# Patient Record
Sex: Male | Born: 1995
Health system: Southern US, Community
[De-identification: ages and names within clinical notes are randomized; demographics above are authoritative.]

## PROBLEM LIST (undated history)

## (undated) DIAGNOSIS — K219 Gastro-esophageal reflux disease without esophagitis: Secondary | ICD-10-CM

## (undated) HISTORY — DX: Gastro-esophageal reflux disease without esophagitis: K21.9

## (undated) HISTORY — PX: HERNIA REPAIR: SHX51

---
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
## 2016-09-05 ENCOUNTER — Ambulatory Visit (HOSPITAL_COMMUNITY)
Admission: EM | Admit: 2016-09-05 | Discharge: 2016-09-05 | Disposition: A | Discharge status: Home / Self Care | Payer: Federal, State, Local not specified - PPO | Attending: Family Medicine | Admitting: Family Medicine

## 2016-09-05 ENCOUNTER — Encounter (HOSPITAL_COMMUNITY): Payer: Self-pay | Admitting: *Deleted

## 2016-09-05 DIAGNOSIS — S39011A Strain of muscle, fascia and tendon of abdomen, initial encounter: Secondary | ICD-10-CM

## 2016-09-05 NOTE — ED Provider Notes (Signed)
[facility]    CSN: [id number] Arrival date & time: 09/05/16  1425     History   Chief Complaint Chief Complaint  Patient presents with  . Abdominal Pain    HPI [name] is a 21 y.o. male.   The history is provided by the patient and a parent.  Abdominal Pain  Pain location:  Suprapubic Pain quality: dull   Pain severity:  Mild Onset quality:  Gradual Duration:  8 weeks Progression:  Worsening Chronicity:  New Context: previous surgery   Relieved by:  Acetaminophen Worsened by:  Nothing Associated symptoms: no anorexia, no belching, no chest pain and no fever     Past Medical History:  Diagnosis Date  . GERD (gastroesophageal reflux disease)     There are no active problems to display for this patient.   Past Surgical History:  Procedure Laterality Date  . HERNIA REPAIR         Home Medications    Prior to Admission medications   Medication Sig Start Date End Date Taking? Authorizing Provider  HYDROcodone-acetaminophen (NORCO) 5-325 MG per tablet Take 1 tablet by mouth every 8 (eight) hours as needed for pain. 04/02/13   Collene Gobble, MD  ibuprofen (ADVIL,MOTRIN) 200 MG tablet Take 200 mg by mouth every 6 (six) hours as needed for pain.    Historical Provider, MD    Family History Family History  Problem Relation Age of Onset  . Hypertension Mother   . Arthritis Mother     Social History Social History  Substance Use Topics  . Smoking status: Never Smoker  . Smokeless tobacco: Not on file  . Alcohol use No     Allergies   Patient has no known allergies.   Review of Systems Review of Systems  Constitutional: Negative.  Negative for fever.  Cardiovascular: Negative.  Negative for chest pain.  Gastrointestinal: Positive for abdominal pain. Negative for anorexia.  Genitourinary: Negative.   Musculoskeletal: Negative.   All other systems reviewed and are negative.    Physical Exam Triage Vital Signs ED Triage  Vitals  Enc Vitals Group     BP 09/05/16 1503 112/70     Pulse Rate 09/05/16 1503 78     Resp 09/05/16 1503 18     Temp 09/05/16 1503 98.6 F (37 C)     Temp Source 09/05/16 1503 Oral     SpO2 09/05/16 1503 100 %     Weight --      Height --      Head Circumference --      Peak Flow --      Pain Score 09/05/16 1505 5     Pain Loc --      Pain Edu? --      Excl. in GC? --    No data found.   Updated Vital Signs BP 112/70 (BP Location: Right Arm)   Pulse 78   Temp 98.6 F (37 C) (Oral)   Resp 18   SpO2 100%   Visual Acuity Right Eye Distance:   Left Eye Distance:   Bilateral Distance:    Right Eye Near:   Left Eye Near:    Bilateral Near:     Physical Exam  Constitutional: He appears well-developed and well-nourished.  Pulmonary/Chest: Effort normal and breath sounds normal.  Abdominal: Soft. Bowel sounds are normal. He exhibits no distension and no mass. There is no tenderness. There is no rebound and no guarding. No hernia.  Musculoskeletal: Normal range of motion.  Neurological: He is alert.  Skin: Skin is warm.  Nursing note and vitals reviewed.    UC Treatments / Results  Labs (all labs ordered are listed, but only abnormal results are displayed) Labs Reviewed - No data to display  EKG  EKG Interpretation None       Radiology No results found.  Procedures Procedures (including critical care time)  Medications Ordered in UC Medications - No data to display   Initial Impression / Assessment and Plan / UC Course  I have reviewed the triage vital signs and the nursing notes.  Pertinent labs & imaging results that were available during my care of the patient were reviewed by me and considered in my medical decision making (see chart for details).       Final Clinical Impressions(s) / UC Diagnoses   Final diagnoses:  None    New Prescriptions New Prescriptions   No medications on file     Linna HoffJames D Lisandro Meggett, MD 09/05/16 1529

## 2016-09-05 NOTE — ED Triage Notes (Signed)
Pt    Reports   Pain     Lower   abd    And      With  Pain    r     Side     -     Pt  Reports  That       He   Was       At   Work  Today   Lifting         And  He  Felt       Some    Pressure        Pain  Was   Worse   Earlier       He  Has  A  History      Of     Hernia     Surgery     In  Past    r  Side   About  8  Years  Ago

## 2016-09-07 DIAGNOSIS — J111 Influenza due to unidentified influenza virus with other respiratory manifestations: Secondary | ICD-10-CM | POA: Diagnosis not present

## 2016-11-30 DIAGNOSIS — K080 Exfoliation of teeth due to systemic causes: Secondary | ICD-10-CM | POA: Diagnosis not present

## 2017-03-29 DIAGNOSIS — K080 Exfoliation of teeth due to systemic causes: Secondary | ICD-10-CM | POA: Diagnosis not present

## 2017-06-21 DIAGNOSIS — K080 Exfoliation of teeth due to systemic causes: Secondary | ICD-10-CM | POA: Diagnosis not present

## 2017-08-05 DIAGNOSIS — K080 Exfoliation of teeth due to systemic causes: Secondary | ICD-10-CM | POA: Diagnosis not present

## 2017-09-04 DIAGNOSIS — S93492A Sprain of other ligament of left ankle, initial encounter: Secondary | ICD-10-CM | POA: Diagnosis not present

## 2017-10-08 DIAGNOSIS — H66003 Acute suppurative otitis media without spontaneous rupture of ear drum, bilateral: Secondary | ICD-10-CM | POA: Diagnosis not present

## 2017-10-08 DIAGNOSIS — H10013 Acute follicular conjunctivitis, bilateral: Secondary | ICD-10-CM | POA: Diagnosis not present

## 2018-06-14 ENCOUNTER — Ambulatory Visit: Payer: Federal, State, Local not specified - PPO | Admitting: Adult Health

## 2018-06-23 ENCOUNTER — Ambulatory Visit (INDEPENDENT_AMBULATORY_CARE_PROVIDER_SITE_OTHER): Payer: Federal, State, Local not specified - PPO

## 2018-06-23 ENCOUNTER — Encounter: Payer: Self-pay | Admitting: Family Medicine

## 2018-06-23 ENCOUNTER — Other Ambulatory Visit: Payer: Self-pay

## 2018-06-23 ENCOUNTER — Ambulatory Visit (INDEPENDENT_AMBULATORY_CARE_PROVIDER_SITE_OTHER): Payer: Federal, State, Local not specified - PPO | Admitting: Family Medicine

## 2018-06-23 VITALS — BP 125/73 | HR 78 | Temp 98.4°F | Resp 14 | Ht 72.0 in | Wt 138.6 lb

## 2018-06-23 DIAGNOSIS — J029 Acute pharyngitis, unspecified: Secondary | ICD-10-CM | POA: Diagnosis not present

## 2018-06-23 DIAGNOSIS — R509 Fever, unspecified: Secondary | ICD-10-CM | POA: Diagnosis not present

## 2018-06-23 DIAGNOSIS — R079 Chest pain, unspecified: Secondary | ICD-10-CM | POA: Diagnosis not present

## 2018-06-23 DIAGNOSIS — R05 Cough: Secondary | ICD-10-CM | POA: Diagnosis not present

## 2018-06-23 DIAGNOSIS — R059 Cough, unspecified: Secondary | ICD-10-CM

## 2018-06-23 DIAGNOSIS — J680 Bronchitis and pneumonitis due to chemicals, gases, fumes and vapors: Secondary | ICD-10-CM

## 2018-06-23 DIAGNOSIS — R0781 Pleurodynia: Secondary | ICD-10-CM | POA: Diagnosis not present

## 2018-06-23 DIAGNOSIS — J689 Unspecified respiratory condition due to chemicals, gases, fumes and vapors: Secondary | ICD-10-CM

## 2018-06-23 DIAGNOSIS — R0989 Other specified symptoms and signs involving the circulatory and respiratory systems: Secondary | ICD-10-CM

## 2018-06-23 LAB — POCT CBC
Granulocyte percent: 73.2 %G (ref 37–80)
HCT, POC: 42.3 % — AB (ref 29–41)
Hemoglobin: 14.5 g/dL — AB (ref 9.5–13.5)
LYMPH, POC: 2.2 (ref 0.6–3.4)
MCH: 32.8 pg — AB (ref 27–31.2)
MCHC: 34.4 g/dL (ref 31.8–35.4)
MCV: 95.2 fL (ref 76–111)
MID (CBC): 0.8 (ref 0–0.9)
MPV: 8.2 fL (ref 0–99.8)
POC GRANULOCYTE: 8.1 — AB (ref 2–6.9)
POC LYMPH PERCENT: 19.9 %L (ref 10–50)
POC MID %: 6.9 % (ref 0–12)
Platelet Count, POC: 220 10*3/uL (ref 142–424)
RBC: 4.44 M/uL — AB (ref 4.69–6.13)
RDW, POC: 12.5 %
WBC: 11 10*3/uL — AB (ref 4.6–10.2)

## 2018-06-23 LAB — POC INFLUENZA A&B (BINAX/QUICKVUE)
INFLUENZA B, POC: NEGATIVE
Influenza A, POC: NEGATIVE

## 2018-06-23 LAB — POCT SEDIMENTATION RATE: POCT SED RATE: 5 mm/hr (ref 0–22)

## 2018-06-23 MED ORDER — IPRATROPIUM BROMIDE 0.02 % IN SOLN
0.5000 mg | Freq: Once | RESPIRATORY_TRACT | Status: AC
  Administered 2018-06-23: 0.5 mg via RESPIRATORY_TRACT

## 2018-06-23 MED ORDER — PREDNISONE 20 MG PO TABS: ORAL_TABLET | ORAL | 0 refills | Status: DC

## 2018-06-23 MED ORDER — ALBUTEROL SULFATE HFA 108 (90 BASE) MCG/ACT IN AERS: 2.0000 | INHALATION_SPRAY | RESPIRATORY_TRACT | 0 refills | Status: DC | PRN

## 2018-06-23 MED ORDER — ALBUTEROL SULFATE (2.5 MG/3ML) 0.083% IN NEBU
2.5000 mg | INHALATION_SOLUTION | Freq: Once | RESPIRATORY_TRACT | Status: AC
  Administered 2018-06-23: 2.5 mg via RESPIRATORY_TRACT

## 2018-06-23 MED ORDER — AMOXICILLIN-POT CLAVULANATE 875-125 MG PO TABS: 1.0000 | ORAL_TABLET | Freq: Two times a day (BID) | ORAL | 0 refills | Status: DC

## 2018-06-23 NOTE — Progress Notes (Signed)
Subjective:    Patient: Calvin GirtDaniel D Foley  DOB: [birth year]; 22 y.o.   MRN: [id number]  Chief Complaint  Patient presents with  . New Patient (Initial Visit)    exposed to chemical burning plastic, now having problems with chest congestion, bleeding nose, stuffy nose, chest tightness feels like a loss of chest compasity.  Marland Kitchen. PHQ9    Score total 17    HPI Works in [employer] job and on Tuesday -2d ago - they burned Government social research officerplastics and he was noticing extra fatigue and more out of it on Tuesday and Wed and then woke up today and feeling really sick, achy, myalgias, so weak he could only get out bed - sneezed with blood clots then right nare started bleeding - eventually tried to go back into work.   Doesn't have a PCP.     Medical History Past Medical History:  Diagnosis Date  . GERD (gastroesophageal reflux disease)    Past Surgical History:  Procedure Laterality Date  . HERNIA REPAIR     No current outpatient medications on file prior to visit.   No current facility-administered medications on file prior to visit.    No Known Allergies Family History  Problem Relation Age of Onset  . Hypertension Mother   . Arthritis Mother    Social History   Socioeconomic History  . Marital status: Single    Spouse name: Not on file  . Number of children: Not on file  . Years of education: Not on file  . Highest education level: Not on file  Occupational History  . Not on file  Social Needs  . Financial resource strain: Not on file  . Food insecurity:    Worry: Not on file    Inability: Not on file  . Transportation needs:    Medical: Not on file    Non-medical: Not on file  Tobacco Use  . Smoking status: Current Every Day Smoker    Types: Cigarettes  . Smokeless tobacco: Current User  . Tobacco comment: only on occ  Substance and Sexual Activity  . Alcohol use: Yes  . Drug use: No  . Sexual activity: Yes  Lifestyle  . Physical activity:    Days per week: Not on file   Minutes per session: Not on file  . Stress: Not on file  Relationships  . Social connections:    Talks on phone: Not on file    Gets together: Not on file    Attends religious service: Not on file    Active member of club or organization: Not on file    Attends meetings of clubs or organizations: Not on file    Relationship status: Not on file  Other Topics Concern  . Not on file  Social History Narrative  . Not on file   [facility] 06/23/2018  Decreased Interest 1  Down, Depressed, Hopeless 2  PHQ - 2 Score 3  Altered sleeping 3  Tired, decreased energy 3  Change in appetite 1  Feeling bad or failure about yourself  1  Trouble concentrating 3  Moving slowly or fidgety/restless 3  Suicidal thoughts 0  PHQ-9 Score 17  Difficult doing work/chores Somewhat difficult    ROS As noted in HPI  Objective:  BP 125/73 (BP Location: Right Arm, Patient Position: Sitting, Cuff Size: Normal)   Pulse 78   Temp 98.4 F (36.9 C) (Oral)   Resp 14   Ht 6' (1.829 m)   Wt 138  lb 9.6 oz (62.9 kg)   SpO2 97%   BMI 18.80 kg/m  Physical Exam Constitutional:      General: He is not in acute distress.    Appearance: He is well-developed. He is not diaphoretic.  HENT:     Head: Normocephalic and atraumatic.  Eyes:     General: No scleral icterus.    Conjunctiva/sclera: Conjunctivae normal.     Pupils: Pupils are equal, round, and reactive to light.  Neck:     Musculoskeletal: Normal range of motion and neck supple.     Thyroid: No thyromegaly.  Cardiovascular:     Rate and Rhythm: Normal rate and regular rhythm.     Heart sounds: Normal heart sounds.  Pulmonary:     Effort: Pulmonary effort is normal. No respiratory distress.     Breath sounds: Normal breath sounds.  Lymphadenopathy:     Cervical: No cervical adenopathy.  Skin:    General: Skin is warm and dry.  Neurological:     Mental Status: He is alert and oriented to person, place, and time.  Psychiatric:         Behavior: Behavior normal.     POC TESTING Office Visit on 06/23/2018  Component Date Value Ref Range Status  . WBC 06/23/2018 11.0* 4.6 - 10.2 K/uL Final  . Lymph, poc 06/23/2018 2.2  0.6 - 3.4 Final  . POC LYMPH PERCENT 06/23/2018 19.9  10 - 50 %L Final  . MID (cbc) 06/23/2018 0.8  0 - 0.9 Final  . POC MID % 06/23/2018 6.9  0 - 12 %M Final  . POC Granulocyte 06/23/2018 8.1* 2 - 6.9 Final  . Granulocyte percent 06/23/2018 73.2  37 - 80 %G Final  . RBC 06/23/2018 4.44* 4.69 - 6.13 M/uL Final  . Hemoglobin 06/23/2018 14.5* 9.5 - 13.5 g/dL Final  . HCT, POC 16/04/9603 42.3* 29 - 41 % Final  . MCV 06/23/2018 95.2  76 - 111 fL Final  . MCH, POC 06/23/2018 32.8* 27 - 31.2 pg Final  . MCHC 06/23/2018 34.4  31.8 - 35.4 g/dL Final  . RDW, POC 54/03/8118 12.5  % Final  . Platelet Count, POC 06/23/2018 220  142 - 424 K/uL Final  . MPV 06/23/2018 8.2  0 - 99.8 fL Final  . Influenza A, POC 06/23/2018 Negative  Negative Final  . Influenza B, POC 06/23/2018 Negative  Negative Final     Dg Chest 2 View  Result Date: 06/23/2018 CLINICAL DATA:  pleuritic chest pain and cough with acute illness x 2d since significant exposure to breathing in plastic EXAM: CHEST - 2 VIEW COMPARISON:  01/07/2012 FINDINGS: The heart size and mediastinal contours are within normal limits. Both lungs are clear. The visualized skeletal structures are unremarkable. IMPRESSION: No active cardiopulmonary disease. Electronically Signed   By: Norva Pavlov M.D.   On: 06/23/2018 15:06   Peak flow 350 Post-duo neb peak flow 475. Predicted 608  Assessment & Plan:   1. Cough   2. Pleurodynia   3. Fever, unspecified   4. Sore throat   5. Inhalation injury due to chemical (HCC)   6. Abnormally low peak expiratory flow rate   7. Acute chemical pneumonitis [facility])     Patient will continue on current chronic medications other than changes noted above, so ok to refill when needed.   See after visit summary for  patient specific instructions.  Orders Placed This Encounter  Procedures  . DG Chest 2 View  Standing Status:   Future    Number of Occurrences:   1    Standing Expiration Date:   06/23/2019    Order Specific Question:   Reason for Exam (SYMPTOM  OR DIAGNOSIS REQUIRED)    Answer:   pleuritic chest pain and cough wiht acute illness x 2d since significant exposure to breathing in plastic    Order Specific Question:   Preferred imaging location?    Answer:   External  . Ambulatory referral to Pulmonology    Referral Priority:   Routine    Referral Type:   Consultation    Referral Reason:   Specialty Services Required    Requested Specialty:   Pulmonary Disease    Number of Visits Requested:   1  . Care order/instruction:    Scheduling Instructions:     Peak Flow (IF NEB IS ORDERED PLEASE DO BEFORE AND AFTER NEB)  . POCT CBC  . POCT SEDIMENTATION RATE  . POC Influenza A&B(BINAX/QUICKVUE)    Meds ordered this encounter  Medications  . albuterol (PROVENTIL) (2.5 MG/3ML) 0.083% nebulizer solution 2.5 mg  . ipratropium (ATROVENT) nebulizer solution 0.5 mg  . amoxicillin-clavulanate (AUGMENTIN) 875-125 MG tablet    Sig: Take 1 tablet by mouth 2 (two) times daily.    Dispense:  14 tablet    Refill:  0  . predniSONE (DELTASONE) 20 MG tablet    Sig: Take 1 tab now, the 3 tabs po qam x 3d starting 12/6 am, then 2 tabs po qam x 3d, then 1 tab po qam x 3d    Dispense:  19 tablet    Refill:  0  . albuterol (PROVENTIL HFA;VENTOLIN HFA) 108 (90 Base) MCG/ACT inhaler    Sig: Inhale 2 puffs into the lungs every 4 (four) hours as needed for wheezing or shortness of breath.    Dispense:  1 Inhaler    Refill:  0    Patient verbalized to me that they understand the following: diagnosis, what is being done for them, what to expect and what should be done at home.  Their questions have been answered. They understand that I am unable to predict every possible medication interaction or adverse  outcome and that if any unexpected symptoms arise, they should contact us and their pharmacist, as well as never hesitate to seek urgent/emergent care at [facility] or ER if they think it might be warranted.    Norberto Sorenson, MD, MPH Primary Care at [facility] [address] [place], [place]  [address] [phone] Office phone  [phone] Office fax  06/23/18 2:38 PM

## 2018-06-23 NOTE — Patient Instructions (Addendum)
[facility] [address], [address], [place], [place] [address] Phone: [phone]  [facility] [address] [place], [place] [address] [phone]  [facility] [address] [place], [place] [address] [phone]  [facility] [address] [place], [place] [address] [phone]   [facility] [address], [place], [place] [address]  [phone]     If you have lab work done today you will be contacted with your lab results within the next 2 weeks.  If you have not heard from us then please contact us. The fastest way to get your results is to register for My Chart.   IF you received an x-ray today, you will receive an invoice from [facility]. Please contact [facility] at [phone] with questions or concerns regarding your invoice.   IF you received labwork today, you will receive an invoice from [facility]. Please contact [facility] at [phone] with questions or concerns regarding your invoice.   Our billing staff will not be able to assist you with questions regarding bills from these companies.  You will be contacted with the lab results as soon as they are available. The fastest way to get your results is to activate your My Chart account. Instructions are located on the last page of this paperwork. If you have not heard from us regarding the results in 2 weeks, please contact this office.     Pneumonitis Pneumonitis is inflammation of the lungs. Infection or exposure to certain substances or allergens can cause this condition. Allergens are substances that you are allergic to. What are the causes? This condition may be caused by:  An infection from bacteria or a virus (pneumonia).  Exposure to certain substances in the workplace. This includes working on farms and in certain industries. Some  substances that can cause this condition include asbestos, silica, inhaled acids, or inhaled chlorine gas.  Repeated exposure to bird feathers, bird feces, or other allergens.  Medicines such as chemotherapy drugs, certain antibiotic medicines, and some heart medicines.  Radiation therapy.  Exposure to mold. A hot tub, sauna, or home humidifier can have mold growing in it, even if it looks clean. You can breathe in the mold through water vapor.  Breathing in (aspirating) stomach contents, food, or liquids into the lungs.  What are the signs or symptoms? Symptoms of this condition include:  Shortness of breath or trouble breathing. This is the most common symptom.  Cough.  Fever.  Decreased energy.  Decreased appetite.  How is this diagnosed? This condition may be diagnosed based on:  Your medical history.  Physical exam.  Blood tests.  Other tests, including: ? Pulmonary function test (PFT). This measures how well your lungs work. ? Chest X-ray. ? CT scan of the lungs. ? Bronchoscopy. In this procedure, your health care provider looks at your airways through an instrument called a bronchoscope. ? Lung biopsy. In this procedure, your health care provider takes a small piece of tissue from your lungs to examine it.  How is this treated? Treatment depends on the cause of the condition. If the cause is exposure to a substance, avoiding further exposure to that substance will help reduce your symptoms. Possible medical treatments for pneumonitis include:  Corticosteroid medicine to help decrease inflammation.  Antibiotic medicine to help fight an infection caused by bacteria.  Bronchodilators or inhalers to help relax the muscles and make breathing  easier.  Oxygen therapy, if you are having trouble breathing.  Follow these instructions at home:  Take or use over-the-counter and prescription medicines only as told by your health care provider. This includes any inhaler  use.  Avoid exposure to any substance that caused your pneumonitis. If you need to work with substances that can cause pneumonitis, wear a mask to protect your lungs.  If you were prescribed an antibiotic, take it as told by your health care provider. Do not stop taking the antibiotic even if you start to feel better.  If you were prescribed an inhaler, keep it with you at all times.  Do not use any products that contain nicotine or tobacco, such as cigarettes and e-cigarettes. If you need help quitting, ask your health care provider.  Keep all follow-up visits as told by your health care provider. This is important. Contact a health care provider if:  You have a fever.  Your symptoms get worse. Get help right away if:  You have new or worse shortness of breath.  You develop a blue color (cyanosis) under your fingernails. Summary  Pneumonitis is inflammation of the lungs. This condition can be caused by infection or exposure to certain substances or allergens.  The most common symptom of this condition is shortness of breath or trouble breathing.  Treatment depends on the cause of your condition. This information is not intended to replace advice given to you by your health care provider. Make sure you discuss any questions you have with your health care provider. Document Released: 12/24/2009 Document Revised: 05/28/2016 Document Reviewed: 05/28/2016 Elsevier Interactive Patient Education  2017 [employer].

## 2018-07-17 ENCOUNTER — Other Ambulatory Visit: Payer: Self-pay

## 2018-07-17 ENCOUNTER — Encounter (HOSPITAL_COMMUNITY): Payer: Self-pay | Admitting: Emergency Medicine

## 2018-07-17 ENCOUNTER — Ambulatory Visit (HOSPITAL_COMMUNITY)
Admission: EM | Admit: 2018-07-17 | Discharge: 2018-07-17 | Disposition: A | Discharge status: Home / Self Care | Payer: Federal, State, Local not specified - PPO | Attending: Family Medicine | Admitting: Family Medicine

## 2018-07-17 DIAGNOSIS — R112 Nausea with vomiting, unspecified: Secondary | ICD-10-CM

## 2018-07-17 DIAGNOSIS — R197 Diarrhea, unspecified: Secondary | ICD-10-CM | POA: Diagnosis not present

## 2018-07-17 MED ORDER — ONDANSETRON 4 MG PO TBDP: 4.0000 mg | ORAL_TABLET | Freq: Three times a day (TID) | ORAL | 0 refills | Status: DC | PRN

## 2018-07-17 NOTE — ED Triage Notes (Signed)
[facility] a [facility].

## 2018-07-17 NOTE — ED Provider Notes (Signed)
[facility]    CSN: [id number] Arrival date & time: 07/17/18  1450     History   Chief Complaint Chief Complaint  Patient presents with  . appointment 3pm  . Nausea    HPI [name] is a 22 y.o. male history of GERD presenting today for evaluation of nausea vomiting and diarrhea.  Patient states for the past 4 to 5 days he is not felt well.  He has had dizziness and nausea that began on Tuesday.  She had some neck pain.  Noted hot and cold chills and believes he had fevers the first few days.  He has had a few episodes of vomiting.  Last episode was yesterday.  He is also had looser bowel movements.  Bowel movements ranging from loose to watery.  Mild congestion, mild cough, otherwise minimal URI symptoms.  States that diarrhea is slightly improving.  He is tolerated oral intake today.  He is taking over-the-counter stomach relief and antidiarrheals. HPI  Past Medical History:  Diagnosis Date  . GERD (gastroesophageal reflux disease)     There are no active problems to display for this patient.   Past Surgical History:  Procedure Laterality Date  . HERNIA REPAIR         Home Medications    Prior to Admission medications   Medication Sig Start Date End Date Taking? Authorizing Provider  ondansetron (ZOFRAN ODT) 4 MG disintegrating tablet Take 1 tablet (4 mg total) by mouth every 8 (eight) hours as needed for nausea or vomiting. 07/17/18   , Junius Creamer, PA-C    Family History Family History  Problem Relation Age of Onset  . Hypertension Mother   . Arthritis Mother     Social History Social History   Tobacco Use  . Smoking status: Current Every Day Smoker    Types: Cigarettes  . Smokeless tobacco: Current User  . Tobacco comment: only on occ  Substance Use Topics  . Alcohol use: Yes  . Drug use: No     Allergies   Patient has no known allergies.   Review of Systems Review of Systems  Constitutional: Positive for appetite  change and chills. Negative for activity change, fatigue and fever.  HENT: Positive for rhinorrhea. Negative for congestion, ear pain, sinus pressure, sore throat and trouble swallowing.   Eyes: Negative for discharge and redness.  Respiratory: Positive for cough. Negative for chest tightness and shortness of breath.   Cardiovascular: Negative for chest pain.  Gastrointestinal: Positive for abdominal pain, diarrhea and vomiting. Negative for blood in stool and nausea.  Musculoskeletal: Negative for myalgias.  Skin: Negative for rash.  Neurological: Positive for dizziness. Negative for light-headedness and headaches.     Physical Exam Triage Vital Signs ED Triage Vitals  Enc Vitals Group     BP 07/17/18 1542 111/71     Pulse Rate 07/17/18 1542 65     Resp 07/17/18 1542 16     Temp 07/17/18 1542 98.2 F (36.8 C)     Temp Source 07/17/18 1542 Oral     SpO2 07/17/18 1542 96 %     Weight --      Height --      Head Circumference --      Peak Flow --      Pain Score 07/17/18 1541 2     Pain Loc --      Pain Edu? --      Excl. in GC? --    No  data found.  Updated Vital Signs BP 111/71 (BP Location: Left Arm)   Pulse 65   Temp 98.2 F (36.8 C) (Oral)   Resp 16   SpO2 96%   Visual Acuity Right Eye Distance:   Left Eye Distance:   Bilateral Distance:    Right Eye Near:   Left Eye Near:    Bilateral Near:     Physical Exam Vitals signs and nursing note reviewed.  Constitutional:      Appearance: He is well-developed.     Comments: Nontoxic appearing, sitting comfortably on exam table  HENT:     Head: Normocephalic and atraumatic.     Ears:     Comments: Bilateral ears without tenderness to palpation of external auricle, tragus and mastoid, EAC's without erythema or swelling, TM's with good bony landmarks and cone of light. Non erythematous.    Mouth/Throat:     Comments: Oral mucosa pink and moist, no tonsillar enlargement or exudate. Posterior pharynx patent and  nonerythematous, no uvula deviation or swelling. Normal phonation. Eyes:     Conjunctiva/sclera: Conjunctivae normal.  Neck:     Musculoskeletal: Neck supple.  Cardiovascular:     Rate and Rhythm: Normal rate and regular rhythm.     Heart sounds: No murmur.  Pulmonary:     Effort: Pulmonary effort is normal. No respiratory distress.     Breath sounds: Normal breath sounds.     Comments: Breathing comfortably at rest, CTABL, no wheezing, rales or other adventitious sounds auscultated Abdominal:     Palpations: Abdomen is soft.     Tenderness: There is abdominal tenderness.     Comments: Soft, nondistended, mild tenderness to palpation throughout entire abdomen, increased on epigastrium and left upper lower quadrant, no focal tenderness, negative rebound, negative Rovsing, negative McBurney's.  Skin:    General: Skin is warm and dry.  Neurological:     Mental Status: He is alert.      UC Treatments / Results  Labs (all labs ordered are listed, but only abnormal results are displayed) Labs Reviewed - No data to display  EKG None  Radiology No results found.  Procedures Procedures (including critical care time)  Medications Ordered in UC Medications - No data to display  Initial Impression / Assessment and Plan / UC Course  I have reviewed the triage vital signs and the nursing notes.  Pertinent labs & imaging results that were available during my care of the patient were reviewed by me and considered in my medical decision making (see chart for details).    Vital signs stable, acute onset of nausea vomiting and diarrhea, most likely viral etiology.  Abdominal exam negative for peritoneal signs.  Do not suspect acute abdomen at this time.  Will recommend symptomatic and supportive care.  Zofran as needed for nausea and vomiting.  Continue to monitor for resolution of diarrhea.  Push fluids, oral rehydration.Discussed strict return precautions. Patient verbalized  understanding and is agreeable with plan.   Final Clinical Impressions(s) / UC Diagnoses   Final diagnoses:  Nausea vomiting and diarrhea     Discharge Instructions     Your nausea, vomiting, and diarrhea appear to have a viral cause. Your symptoms should improve over the next week as your body continues to rid the infectious cause.  For nausea: Zofran prescribed. Begin with every 6 hours, than as you are able to hold food down, take it as needed. Start with clear liquids, then move to plain foods like bananas, rice,  applesauce, toast, broth, grits, oatmeal. As those food settle okay you may transition to your normal foods. Avoid spicy and greasy foods as much as possible.  For Diarrhea: This is your body's natural way of getting rid of a virus. You may try taking 1 imodium to decrease amount of stools a day, but we do not want you to stop your diarrhea.   Preventing dehydration is key! You need to replace the fluid your body is expelling. Drink plenty of fluids, may use Pedialyte or sports drinks.   Please return if you are experiencing blood in your vomit or stool or experiencing dizziness, lightheadedness, extreme fatigue, increased abdominal pain.    ED Prescriptions    Medication Sig Dispense Auth. Provider   ondansetron (ZOFRAN ODT) 4 MG disintegrating tablet Take 1 tablet (4 mg total) by mouth every 8 (eight) hours as needed for nausea or vomiting. 20 tablet , Enc[place] C, PA-C     Controlled Substance Prescriptions [place] Controlled Substance Registry consulted? Not Applicable   Lew Dawes,  C, [place]PA-C 07/17/18 1610

## 2018-07-17 NOTE — Discharge Instructions (Signed)

## 2018-07-20 ENCOUNTER — Other Ambulatory Visit: Payer: Self-pay | Admitting: Family Medicine

## 2018-07-21 NOTE — Telephone Encounter (Signed)
Requested medication (s) are due for refill today: No - early  Requested medication (s) are on the active medication list: No  Last refill:  06/26/18  Future visit scheduled: No  Notes to clinic:  Medication not on list    Requested Prescriptions  Pending Prescriptions Disp Refills   albuterol (PROVENTIL HFA;VENTOLIN HFA) 108 (90 Base) MCG/ACT inhaler [Pharmacy Med Name: ALBUTEROL HFA (PROVENTIL) INH] 6.7 Inhaler 0    Sig: Inhale 2 puffs into the lungs every 4 (four) hours as needed for wheezing or shortness of breath.     Pulmonology:  Beta Agonists Failed - 07/20/2018  1:01 AM      Failed - One inhaler should last at least one month. If the patient is requesting refills earlier, contact the patient to check for uncontrolled symptoms.      Failed - Valid encounter within last 12 months    Recent Outpatient Visits          4 weeks ago Cough   [facility], Levell July, MD   5 years ago Pain in joint, ankle and foot, left   [facility], [facility]   5 years ago Pain in joint, ankle and foot, left   [facility]   5 years ago Right ankle pain   [facility], Rachelle Hora, DO      Future Appointments            In 6 days Philip Aspen, Limmie Patricia, MD [facility] [place], [facility]

## 2018-07-27 ENCOUNTER — Ambulatory Visit: Payer: Federal, State, Local not specified - PPO | Admitting: Internal Medicine

## 2018-07-27 DIAGNOSIS — Z0289 Encounter for other administrative examinations: Secondary | ICD-10-CM

## 2018-08-05 ENCOUNTER — Institutional Professional Consult (permissible substitution): Payer: Federal, State, Local not specified - PPO | Admitting: Pulmonary Disease

## 2018-08-19 ENCOUNTER — Institutional Professional Consult (permissible substitution): Payer: Federal, State, Local not specified - PPO | Admitting: Pulmonary Disease

## 2019-05-24 DIAGNOSIS — R05 Cough: Secondary | ICD-10-CM | POA: Diagnosis not present

## 2019-05-24 DIAGNOSIS — Z20828 Contact with and (suspected) exposure to other viral communicable diseases: Secondary | ICD-10-CM | POA: Diagnosis not present

## 2019-05-24 DIAGNOSIS — M791 Myalgia, unspecified site: Secondary | ICD-10-CM | POA: Diagnosis not present

## 2019-05-24 DIAGNOSIS — R5381 Other malaise: Secondary | ICD-10-CM | POA: Diagnosis not present

## 2019-06-20 DIAGNOSIS — Z20828 Contact with and (suspected) exposure to other viral communicable diseases: Secondary | ICD-10-CM | POA: Diagnosis not present

## 2019-08-26 DIAGNOSIS — Z20822 Contact with and (suspected) exposure to covid-19: Secondary | ICD-10-CM | POA: Diagnosis not present

## 2019-08-26 DIAGNOSIS — J029 Acute pharyngitis, unspecified: Secondary | ICD-10-CM | POA: Diagnosis not present

## 2019-11-21 DIAGNOSIS — Z20822 Contact with and (suspected) exposure to covid-19: Secondary | ICD-10-CM | POA: Diagnosis not present

## 2019-11-24 ENCOUNTER — Ambulatory Visit
Admission: EM | Admit: 2019-11-24 | Discharge: 2019-11-24 | Disposition: A | Discharge status: Home / Self Care | Payer: Federal, State, Local not specified - PPO | Attending: Emergency Medicine | Admitting: Emergency Medicine

## 2019-11-24 ENCOUNTER — Other Ambulatory Visit: Payer: Self-pay

## 2019-11-24 ENCOUNTER — Encounter: Payer: Self-pay | Admitting: Emergency Medicine

## 2019-11-24 DIAGNOSIS — J209 Acute bronchitis, unspecified: Secondary | ICD-10-CM | POA: Diagnosis not present

## 2019-11-24 DIAGNOSIS — K529 Noninfective gastroenteritis and colitis, unspecified: Secondary | ICD-10-CM

## 2019-11-24 MED ORDER — FLUTICASONE PROPIONATE 50 MCG/ACT NA SUSP
1.0000 | Freq: Every day | NASAL | 0 refills | Status: DC
Start: 2019-11-24 — End: 2020-06-06

## 2019-11-24 MED ORDER — ALBUTEROL SULFATE HFA 108 (90 BASE) MCG/ACT IN AERS: 2.0000 | INHALATION_SPRAY | RESPIRATORY_TRACT | 0 refills | Status: DC | PRN

## 2019-11-24 MED ORDER — AEROCHAMBER PLUS FLO-VU MEDIUM MISC
1.0000 | Freq: Once | 0 refills | Status: AC
Start: 2019-11-24 — End: 2019-11-24

## 2019-11-24 MED ORDER — ONDANSETRON 4 MG PO TBDP
4.0000 mg | ORAL_TABLET | Freq: Three times a day (TID) | ORAL | 0 refills | Status: DC | PRN
Start: 2019-11-24 — End: 2020-06-06

## 2019-11-24 MED ORDER — CETIRIZINE HCL 10 MG PO TABS
10.0000 mg | ORAL_TABLET | Freq: Every day | ORAL | 0 refills | Status: DC
Start: 2019-11-24 — End: 2020-06-06

## 2019-11-24 MED ORDER — BENZONATATE 100 MG PO CAPS: 100.0000 mg | ORAL_CAPSULE | Freq: Three times a day (TID) | ORAL | 0 refills | Status: DC

## 2019-11-24 NOTE — ED Triage Notes (Signed)
Pt c/o losing voice on Saturday, then chills, fatigue, body aches and cough. Tested negative for COVID on Wednesday, symptoms have not improved.

## 2019-11-24 NOTE — Discharge Instructions (Signed)

## 2019-11-24 NOTE — ED Provider Notes (Signed)
[facility]    CSN: [id number] Arrival date & time: 11/24/19  1447      History   Chief Complaint Chief Complaint  Patient presents with  . Fatigue    HPI [name] is a 24 y.o. male with history of GERD presenting for GI symptoms and cough.  Symptoms began Saturday with laryngitis (resolved), chills, fatigue, myalgias.  Cough is mildly productive: Without blood.  No wheezing, difficulty breathing, lower extremities edema, chest pain, palpitations.  Patient denying fever.  Underwent Covid testing at external urgent care site: PCR negative.  Has had intermittent nausea without known exacerbating or alleviating factors.  Single episode of emesis that was without blood or bile.  No headache, increased reflux, urinary symptoms.  Patient does admit looser stools, the last bowel movement this morning was normal per patient.  No blood or melena.  Past Medical History:  Diagnosis Date  . GERD (gastroesophageal reflux disease)     There are no problems to display for this patient.   Past Surgical History:  Procedure Laterality Date  . HERNIA REPAIR         Home Medications    Prior to Admission medications   Medication Sig Start Date End Date Taking? Authorizing Provider  albuterol (VENTOLIN HFA) 108 (90 Base) MCG/ACT inhaler Inhale 2 puffs into the lungs every 4 (four) hours as needed for wheezing or shortness of breath. 11/24/19   Hall-Potvin, [place], PA-C  benzonatate (TESSALON) 100 MG capsule Take 1 capsule (100 mg total) by mouth every 8 (eight) hours. 11/24/19   Hall-Potvin, [place], PA-C  cetirizine (ZYRTEC ALLERGY) 10 MG tablet Take 1 tablet (10 mg total) by mouth daily. 11/24/19   Hall-Potvin, [place], PA-C  fluticasone (FLONASE) 50 MCG/ACT nasal spray Place 1 spray into both nostrils daily. 11/24/19   Hall-Potvin, [place], PA-C  ondansetron (ZOFRAN ODT) 4 MG disintegrating tablet Take 1 tablet (4 mg total) by mouth every 8 (eight) hours as needed for nausea  or vomiting. 11/24/19   Hall-Potvin, [place], PA-C  Spacer/Aero-Holding Chambers (AEROCHAMBER PLUS FLO-VU MEDIUM) MISC 1 each by Other route once for 1 dose. 11/24/19 11/24/19  Hall-Potvin, [place], PA-C    Family History Family History  Problem Relation Age of Onset  . Hypertension Mother   . Arthritis Mother     Social History Social History   Tobacco Use  . Smoking status: Current Every Day Smoker    Types: Cigarettes  . Smokeless tobacco: Current User  . Tobacco comment: only on occ  Substance Use Topics  . Alcohol use: Yes  . Drug use: No     Allergies   Patient has no known allergies.   Review of Systems As per HPI   Physical Exam Triage Vital Signs ED Triage Vitals  Enc Vitals Group     BP      Pulse      Resp      Temp      Temp src      SpO2      Weight      Height      Head Circumference      Peak Flow      Pain Score      Pain Loc      Pain Edu?      Excl. in GC?    No data found.  Updated Vital Signs BP (!) 133/94   Pulse 82   Temp 98.2 F (36.8 C) Comment: Tylenol taken PTA  Resp  16   SpO2 98%   Visual Acuity Right Eye Distance:   Left Eye Distance:   Bilateral Distance:    Right Eye Near:   Left Eye Near:    Bilateral Near:     Physical Exam Constitutional:      General: He is not in acute distress.    Appearance: He is not toxic-appearing or diaphoretic.  HENT:     Head: Normocephalic and atraumatic.     Mouth/Throat:     Mouth: Mucous membranes are moist.     Pharynx: Oropharynx is clear.  Eyes:     General: No scleral icterus.    Conjunctiva/sclera: Conjunctivae normal.     Pupils: Pupils are equal, round, and reactive to light.  Neck:     Comments: Trachea midline, negative JVD Cardiovascular:     Rate and Rhythm: Normal rate and regular rhythm.  Pulmonary:     Effort: Pulmonary effort is normal. No respiratory distress.     Breath sounds: No wheezing.  Abdominal:     General: Abdomen is flat. Bowel sounds are  normal. There is no distension.     Palpations: Abdomen is soft.     Tenderness: There is abdominal tenderness. There is no guarding or rebound.     Comments: Mild LLQ TTP  Musculoskeletal:     Cervical back: Neck supple. No tenderness.  Lymphadenopathy:     Cervical: No cervical adenopathy.  Skin:    Capillary Refill: Capillary refill takes less than 2 seconds.     Coloration: Skin is not jaundiced or pale.     Findings: No rash.  Neurological:     Mental Status: He is alert and oriented to person, place, and time.      UC Treatments / Results  Labs (all labs ordered are listed, but only abnormal results are displayed) Labs Reviewed - No data to display  EKG   Radiology No results found.  Procedures Procedures (including critical care time)  Medications Ordered in UC Medications - No data to display  Initial Impression / Assessment and Plan / UC Course  I have reviewed the triage vital signs and the nursing notes.  Pertinent labs & imaging results that were available during my care of the patient were reviewed by me and considered in my medical decision making (see chart for details).     Patient afebrile, nontoxic in office today.  Abdominal exam benign.  Will defer repeat Covid testing as patient already underwent PCR after development of symptoms.  Will treat supportively as outlined below.  Return precautions discussed, patient verbalized understanding and is agreeable to plan. Final Clinical Impressions(s) / UC Diagnoses   Final diagnoses:  Gastroenteritis  Acute bronchitis, unspecified organism     Discharge Instructions     Tessalon for cough. Start flonase, atrovent nasal spray for nasal congestion/drainage. You can use over the counter nasal saline rinse such as neti pot for nasal congestion. Keep hydrated, your urine should be clear to pale yellow in color. Tylenol/motrin for fever and pain. Monitor for any worsening of symptoms, chest pain, shortness  of breath, wheezing, swelling of the throat, go to the emergency department for further evaluation needed.     ED Prescriptions    Medication Sig Dispense Auth. Provider   benzonatate (TESSALON) 100 MG capsule Take 1 capsule (100 mg total) by mouth every 8 (eight) hours. 21 capsule Hall-Potvin, [place], PA-C   cetirizine (ZYRTEC ALLERGY) 10 MG tablet Take 1 tablet (10 mg total) by  mouth daily. 30 tablet Hall-Potvin, [place], PA-C   fluticasone (FLONASE) 50 MCG/ACT nasal spray Place 1 spray into both nostrils daily. 16 g Hall-Potvin, [place], PA-C   ondansetron (ZOFRAN ODT) 4 MG disintegrating tablet Take 1 tablet (4 mg total) by mouth every 8 (eight) hours as needed for nausea or vomiting. 21 tablet Hall-Potvin, [place], PA-C   albuterol (VENTOLIN HFA) 108 (90 Base) MCG/ACT inhaler Inhale 2 puffs into the lungs every 4 (four) hours as needed for wheezing or shortness of breath. 18 g Hall-Potvin, [place], PA-C   Spacer/Aero-Holding Chambers (AEROCHAMBER PLUS FLO-VU MEDIUM) MISC 1 each by Other route once for 1 dose. 1 each Hall-Potvin, [place], PA-C     PDMP not reviewed this encounter.   Hall-Potvin, [place], [place] 11/24/19 1615

## 2020-03-04 DIAGNOSIS — Z20828 Contact with and (suspected) exposure to other viral communicable diseases: Secondary | ICD-10-CM | POA: Diagnosis not present

## 2020-04-15 DIAGNOSIS — M25571 Pain in right ankle and joints of right foot: Secondary | ICD-10-CM | POA: Diagnosis not present

## 2020-04-15 DIAGNOSIS — M79671 Pain in right foot: Secondary | ICD-10-CM | POA: Diagnosis not present

## 2020-04-19 DIAGNOSIS — M25571 Pain in right ankle and joints of right foot: Secondary | ICD-10-CM | POA: Diagnosis not present

## 2020-05-03 DIAGNOSIS — Z20822 Contact with and (suspected) exposure to covid-19: Secondary | ICD-10-CM | POA: Diagnosis not present

## 2020-05-03 DIAGNOSIS — M25571 Pain in right ankle and joints of right foot: Secondary | ICD-10-CM | POA: Diagnosis not present

## 2020-05-21 ENCOUNTER — Ambulatory Visit: Payer: Federal, State, Local not specified - PPO | Admitting: Family Medicine

## 2020-05-21 DIAGNOSIS — Z0289 Encounter for other administrative examinations: Secondary | ICD-10-CM

## 2020-06-06 ENCOUNTER — Other Ambulatory Visit: Payer: Self-pay

## 2020-06-06 ENCOUNTER — Ambulatory Visit
Admission: EM | Admit: 2020-06-06 | Discharge: 2020-06-06 | Disposition: A | Discharge status: Home / Self Care | Payer: Federal, State, Local not specified - PPO | Attending: Physician Assistant | Admitting: Physician Assistant

## 2020-06-06 DIAGNOSIS — Z20822 Contact with and (suspected) exposure to covid-19: Secondary | ICD-10-CM | POA: Diagnosis not present

## 2020-06-06 DIAGNOSIS — J069 Acute upper respiratory infection, unspecified: Secondary | ICD-10-CM | POA: Insufficient documentation

## 2020-06-06 LAB — POCT RAPID STREP A (OFFICE): Rapid Strep A Screen: NEGATIVE

## 2020-06-06 MED ORDER — ALBUTEROL SULFATE HFA 108 (90 BASE) MCG/ACT IN AERS: 2.0000 | INHALATION_SPRAY | RESPIRATORY_TRACT | 0 refills | Status: DC | PRN

## 2020-06-06 MED ORDER — BENZONATATE 200 MG PO CAPS
200.0000 mg | ORAL_CAPSULE | Freq: Three times a day (TID) | ORAL | 0 refills | Status: DC
Start: 2020-06-06 — End: 2021-04-02

## 2020-06-06 MED ORDER — LIDOCAINE VISCOUS HCL 2 % MT SOLN
OROMUCOSAL | 0 refills | Status: DC
Start: 2020-06-06 — End: 2021-04-02

## 2020-06-06 NOTE — ED Provider Notes (Signed)
[facility]    CSN: [id number] Arrival date & time: 06/06/20  1321      History   Chief Complaint Chief Complaint  Patient presents with  . Sore Throat    HPI [name] is a 24 y.o. male.   24 year old male comes in for 3 day of URI symptoms. Nasal congestion, sore throat, cough, diarrhea. States had one episode of vomiting when he first woke up with some nausea, this has since resolved and has tolerated oral intake. Denies abdominal pain. tmax 99.9, responsive to antipyretic. Has had body aches. States having some chest tightness causing short of breath, not interfering with activity. Denies loss of taste/smell. COVID vaccinated.      Past Medical History:  Diagnosis Date  . GERD (gastroesophageal reflux disease)     There are no problems to display for this patient.   Past Surgical History:  Procedure Laterality Date  . HERNIA REPAIR        Home Medications    Prior to Admission medications   Medication Sig Start Date End Date Taking? Authorizing Provider  albuterol (VENTOLIN HFA) 108 (90 Base) MCG/ACT inhaler Inhale 2 puffs into the lungs every 4 (four) hours as needed for wheezing or shortness of breath. 06/06/20   Cathie Hoops, Roseana Rhine V, PA-C  benzonatate (TESSALON) 200 MG capsule Take 1 capsule (200 mg total) by mouth every 8 (eight) hours. 06/06/20   Cathie Hoops, Kingjames Coury V, PA-C  lidocaine (XYLOCAINE) 2 % solution 5-15 mL gargle as needed 06/06/20   Belinda Fisher, PA-C    Family History Family History  Problem Relation Age of Onset  . Hypertension Mother   . Arthritis Mother     Social History Social History   Tobacco Use  . Smoking status: Current Every Day Smoker    Types: Cigarettes  . Smokeless tobacco: Current User  . Tobacco comment: only on occ  Substance Use Topics  . Alcohol use: Yes  . Drug use: No     Allergies   Patient has no known allergies.   Review of Systems Review of Systems  Reason unable to perform ROS: See HPI as above.      Physical Exam Triage Vital Signs ED Triage Vitals [06/06/20 1333]  Enc Vitals Group     BP 133/89     Pulse Rate 68     Resp 18     Temp 97.9 F (36.6 C)     Temp Source Oral     SpO2 98 %     Weight      Height      Head Circumference      Peak Flow      Pain Score 0     Pain Loc      Pain Edu?      Excl. in GC?    No data found.  Updated Vital Signs BP 133/89 (BP Location: Left Arm)   Pulse 68   Temp 97.9 F (36.6 C) (Oral)   Resp 18   SpO2 98%   Physical Exam Constitutional:      General: He is not in acute distress.    Appearance: Normal appearance. He is not ill-appearing, toxic-appearing or diaphoretic.  HENT:     Head: Normocephalic and atraumatic.     Nose: Rhinorrhea present.     Mouth/Throat:     Mouth: Mucous membranes are moist.     Pharynx: Oropharynx is clear. Uvula midline. Posterior oropharyngeal erythema present.  Tonsils: No tonsillar exudate. 1+ on the right. 1+ on the left.  Cardiovascular:     Rate and Rhythm: Normal rate and regular rhythm.     Heart sounds: Normal heart sounds. No murmur heard.  No friction rub. No gallop.   Pulmonary:     Effort: Pulmonary effort is normal. No accessory muscle usage, prolonged expiration, respiratory distress or retractions.     Comments: Lungs clear to auscultation without adventitious lung sounds. Musculoskeletal:     Cervical back: Normal range of motion and neck supple.  Neurological:     General: No focal deficit present.     Mental Status: He is alert and oriented to person, place, and time.      UC Treatments / Results  Labs (all labs ordered are listed, but only abnormal results are displayed) Labs Reviewed  NOVEL CORONAVIRUS, NAA  CULTURE, GROUP A STREP [facility])  POCT RAPID STREP A (OFFICE)    EKG   Radiology No results found.  Procedures Procedures (including critical care time)  Medications Ordered in UC Medications - No data to display  Initial Impression /  Assessment and Plan / UC Course  I have reviewed the triage vital signs and the nursing notes.  Pertinent labs & imaging results that were available during my care of the patient were reviewed by me and considered in my medical decision making (see chart for details).    COVID PCR test ordered. Patient to quarantine until testing results return. No alarming signs on exam. LCTAB. Symptomatic treatment discussed.  Push fluids.  Return precautions given.  Patient expresses understanding and agrees to plan.  Final Clinical Impressions(s) / UC Diagnoses   Final diagnoses:  Encounter for screening laboratory testing for COVID-19 virus  Viral URI    ED Prescriptions    Medication Sig Dispense Auth. Provider   albuterol (VENTOLIN HFA) 108 (90 Base) MCG/ACT inhaler Inhale 2 puffs into the lungs every 4 (four) hours as needed for wheezing or shortness of breath. 18 g Shanesha Bednarz V, PA-C   benzonatate (TESSALON) 200 MG capsule Take 1 capsule (200 mg total) by mouth every 8 (eight) hours. 21 capsule Elisheba Mcdonnell V, PA-C   lidocaine (XYLOCAINE) 2 % solution 5-15 mL gargle as needed 150 mL Belinda Fisher, PA-C     PDMP not reviewed this encounter.   Belinda Fisher, PA-C 06/06/20 1352

## 2020-06-06 NOTE — ED Triage Notes (Addendum)
Pt c/o sore throat with nasal congestion since Monday. Pt c/o vomiting x1 and diarrhea today. Hx of strep and feels similar.

## 2020-06-06 NOTE — Discharge Instructions (Signed)
COVID PCR testing ordered. I would like you to quarantine until testing results. Tessalon for cough. Albuterol as needed for shortness of breath. Start lidocaine for sore throat, do not eat or drink for the next 40 mins after use as it can stunt your gag reflex. You can use over the counter flonase/nasacort for nasal congestion/drainage. You can use over the counter nasal saline rinse such as neti pot for nasal congestion. Keep hydrated, your urine should be clear to pale yellow in color. Tylenol/motrin for fever and pain.  If experiencing shortness of breath, trouble breathing, go to the emergency department for further evaluation needed.

## 2020-06-07 LAB — SARS-COV-2, NAA 2 DAY TAT

## 2020-06-07 LAB — NOVEL CORONAVIRUS, NAA: SARS-CoV-2, NAA: NOT DETECTED

## 2020-06-10 LAB — CULTURE, GROUP A STREP (THRC)

## 2020-07-31 DIAGNOSIS — Z1152 Encounter for screening for COVID-19: Secondary | ICD-10-CM | POA: Diagnosis not present

## 2021-03-03 ENCOUNTER — Other Ambulatory Visit: Payer: Self-pay

## 2021-03-03 ENCOUNTER — Encounter (HOSPITAL_BASED_OUTPATIENT_CLINIC_OR_DEPARTMENT_OTHER): Payer: Self-pay

## 2021-03-03 ENCOUNTER — Emergency Department (HOSPITAL_BASED_OUTPATIENT_CLINIC_OR_DEPARTMENT_OTHER)
Admission: EM | Admit: 2021-03-03 | Discharge: 2021-03-03 | Discharge status: Against Medical Advice | Payer: Federal, State, Local not specified - PPO | Attending: Emergency Medicine | Admitting: Emergency Medicine

## 2021-03-03 DIAGNOSIS — Z5321 Procedure and treatment not carried out due to patient leaving prior to being seen by health care provider: Secondary | ICD-10-CM | POA: Insufficient documentation

## 2021-03-03 DIAGNOSIS — S0012XA Contusion of left eyelid and periocular area, initial encounter: Secondary | ICD-10-CM | POA: Insufficient documentation

## 2021-03-03 DIAGNOSIS — S0993XA Unspecified injury of face, initial encounter: Secondary | ICD-10-CM | POA: Diagnosis not present
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
## 2021-04-02 ENCOUNTER — Ambulatory Visit: Payer: Federal, State, Local not specified - PPO | Admitting: Family Medicine

## 2021-04-02 ENCOUNTER — Other Ambulatory Visit: Payer: Self-pay

## 2021-04-02 ENCOUNTER — Encounter: Payer: Self-pay | Admitting: Family Medicine

## 2021-04-02 VITALS — BP 128/70 | HR 95 | Temp 98.2°F | Ht 70.0 in | Wt 168.5 lb

## 2021-04-02 DIAGNOSIS — Z23 Encounter for immunization: Secondary | ICD-10-CM

## 2021-04-02 DIAGNOSIS — Z1159 Encounter for screening for other viral diseases: Secondary | ICD-10-CM | POA: Diagnosis not present

## 2021-04-02 DIAGNOSIS — Z0000 Encounter for general adult medical examination without abnormal findings: Secondary | ICD-10-CM | POA: Diagnosis not present

## 2021-04-02 DIAGNOSIS — Z114 Encounter for screening for human immunodeficiency virus [HIV]: Secondary | ICD-10-CM

## 2021-04-02 NOTE — Progress Notes (Signed)
Chief Complaint  Patient presents with   New Patient (Initial Visit)    Would like a Physical     Well Male [name] is here for a complete physical.   His last physical was >1 year ago.  Current diet: in general, diet is fair.   Current exercise: active at work, running Weight trend: stable Fatigue out of ordinary? No. Seat belt? Yes.    Health maintenance Tetanus- No HIV- No Hep C- No  Past Medical History:  Diagnosis Date   GERD (gastroesophageal reflux disease)      Past Surgical History:  Procedure Laterality Date   HERNIA REPAIR      Medications  Takes no meds routinely.  Allergies No Known Allergies  Family History Family History  Problem Relation Age of Onset   Hypertension Mother    Arthritis Mother     Review of Systems: Constitutional: no fevers or chills Eye:  no recent significant change in vision Ear/Nose/Mouth/Throat:  Ears:  +b/l hearing loss worse on L over years Nose/Mouth/Throat:  no complaints of nasal congestion, no sore throat Cardiovascular:  no chest pain Respiratory:  no shortness of breath Gastrointestinal:  no abdominal pain, no change in bowel habits GU:  Male: negative for dysuria Musculoskeletal/Extremities:  no pain of the joints Integumentary (Skin/Breast):  no abnormal skin lesions reported Neurologic:  no headaches Endocrine: No unexpected weight changes Hematologic/Lymphatic:  no night sweats  Exam BP 128/70   Pulse 95   Temp 98.2 F (36.8 C) (Oral)   Ht 5\' 10"  (1.778 m)   Wt 168 lb 8 oz (76.4 kg)   SpO2 99%   BMI 24.18 kg/m  General:  well developed, well nourished, in no apparent distress Skin:  no significant moles, warts, or growths Head:  no masses, lesions, or tenderness Eyes:  pupils equal and round, sclera anicteric without injection Ears:  canals without lesions, TMs shiny without retraction, no obvious effusion, no erythema Nose:  nares patent, septum midline, mucosa normal Throat/Pharynx:   lips and gingiva without lesion; tongue and uvula midline; non-inflamed pharynx; no exudates or postnasal drainage Neck: neck supple without adenopathy, thyromegaly, or masses Lungs:  clear to auscultation, breath sounds equal bilaterally, no respiratory distress Cardio:  regular rate and rhythm, no bruits, no LE edema Abdomen:  abdomen soft, nontender; bowel sounds normal; no masses or organomegaly Genital (male): Deferred Rectal: Deferred Musculoskeletal:  symmetrical muscle groups noted without atrophy or deformity Extremities:  no clubbing, cyanosis, or edema, no deformities, no skin discoloration Neuro:  gait normal; deep tendon reflexes normal and symmetric Psych: well oriented with normal range of affect and appropriate judgment/insight  Assessment and Plan  Well adult exam - Plan: CBC, Comprehensive metabolic panel, Lipid panel  Encounter for hepatitis C screening test for low risk patient - Plan: Hepatitis C antibody  Screening for HIV without presence of risk factors - Plan: HIV Antibody (routine testing w rflx)  Need for Tdap vaccination - Plan: Tdap vaccine greater than or equal to 7yo IM   Well 25 y.o. male. Counseled on diet and exercise. Self testicular exams recommended at least monthly.  Flu shot rec'd for mid Oct. Audiology exam revealed 10% hearing loss on R, 50% on L. Likely from loud noise exposure. Offered ENT input, declined politely at this time.  Tdap today. Records release from peds office to see if/when he got his 3rd HPV.  Other orders as above. Follow up in 1 year pending the above workup. The patient  voiced understanding and agreement to the plan.  Jilda Roche [place], DO 04/02/21 1:36 PM

## 2021-04-02 NOTE — Patient Instructions (Signed)
Give us 2-3 business days to get the results of your labs back.   Keep the diet clean and stay active.  I recommend getting the flu shot in mid October. This suggestion would change if the CDC comes out with a different recommendation.   Do monthly self testicular checks in the shower. You are feeling for lumps/bumps that don't belong. If you feel anything like this, let me know!  Let us know if you need anything. 

## 2021-04-03 LAB — CBC
HCT: 47.2 % (ref 39.0–52.0)
Hemoglobin: 16 g/dL (ref 13.0–17.0)
MCHC: 33.9 g/dL (ref 30.0–36.0)
MCV: 102 fl — ABNORMAL HIGH (ref 78.0–100.0)
Platelets: 264 10*3/uL (ref 150.0–400.0)
RBC: 4.63 Mil/uL (ref 4.22–5.81)
RDW: 12.9 % (ref 11.5–15.5)
WBC: 8.4 10*3/uL (ref 4.0–10.5)

## 2021-04-03 LAB — HIV ANTIBODY (ROUTINE TESTING W REFLEX): HIV 1&2 Ab, 4th Generation: NONREACTIVE

## 2021-04-03 LAB — COMPREHENSIVE METABOLIC PANEL
ALT: 34 U/L (ref 0–53)
AST: 24 U/L (ref 0–37)
Albumin: 4.8 g/dL (ref 3.5–5.2)
Alkaline Phosphatase: 111 U/L (ref 39–117)
BUN: 11 mg/dL (ref 6–23)
CO2: 29 mEq/L (ref 19–32)
Calcium: 10.5 mg/dL (ref 8.4–10.5)
Chloride: 98 mEq/L (ref 96–112)
Creatinine, Ser: 0.84 mg/dL (ref 0.40–1.50)
GFR: 122.09 mL/min (ref >60.00)
Glucose, Bld: 78 mg/dL (ref 70–99)
Potassium: 4.2 mEq/L (ref 3.5–5.1)
Sodium: 137 mEq/L (ref 135–145)
Total Bilirubin: 0.6 mg/dL (ref 0.2–1.2)
Total Protein: 8.1 g/dL (ref 6.0–8.3)

## 2021-04-03 LAB — LIPID PANEL
Cholesterol: 226 mg/dL — ABNORMAL HIGH (ref 0–200)
HDL: 64.8 mg/dL (ref >39.00)
LDL Cholesterol: 124 mg/dL — ABNORMAL HIGH (ref 0–99)
NonHDL: 161.67
Total CHOL/HDL Ratio: 3
Triglycerides: 190 mg/dL — ABNORMAL HIGH (ref 0.0–149.0)
VLDL: 38 mg/dL (ref 0.0–40.0)

## 2021-04-03 LAB — HEPATITIS C ANTIBODY
Hepatitis C Ab: NONREACTIVE
SIGNAL TO CUT-OFF: 0.02 (ref <1.00)

## 2021-04-04 ENCOUNTER — Other Ambulatory Visit (INDEPENDENT_AMBULATORY_CARE_PROVIDER_SITE_OTHER): Payer: Federal, State, Local not specified - PPO

## 2021-04-04 ENCOUNTER — Other Ambulatory Visit: Payer: Self-pay | Admitting: Family Medicine

## 2021-04-04 DIAGNOSIS — E538 Deficiency of other specified B group vitamins: Secondary | ICD-10-CM

## 2021-04-04 DIAGNOSIS — R7989 Other specified abnormal findings of blood chemistry: Secondary | ICD-10-CM

## 2021-04-04 DIAGNOSIS — D7589 Other specified diseases of blood and blood-forming organs: Secondary | ICD-10-CM

## 2021-04-04 LAB — FOLATE: Folate: 5.8 ng/mL — ABNORMAL LOW (ref >5.9)

## 2021-04-04 LAB — VITAMIN B12: Vitamin B-12: 209 pg/mL — ABNORMAL LOW (ref 211–911)

## 2021-05-05 ENCOUNTER — Other Ambulatory Visit: Payer: Federal, State, Local not specified - PPO

## 2021-05-14 ENCOUNTER — Other Ambulatory Visit: Payer: Self-pay

## 2021-05-14 ENCOUNTER — Other Ambulatory Visit (INDEPENDENT_AMBULATORY_CARE_PROVIDER_SITE_OTHER): Payer: Federal, State, Local not specified - PPO

## 2021-05-14 DIAGNOSIS — D7589 Other specified diseases of blood and blood-forming organs: Secondary | ICD-10-CM

## 2021-05-15 LAB — VITAMIN B12: Vitamin B-12: 342 pg/mL (ref 211–911)

## 2021-05-15 LAB — FOLATE: Folate: 9.6 ng/mL (ref >5.9)

## 2021-08-26 DIAGNOSIS — R059 Cough, unspecified: Secondary | ICD-10-CM | POA: Diagnosis not present

## 2021-08-26 DIAGNOSIS — J101 Influenza due to other identified influenza virus with other respiratory manifestations: Secondary | ICD-10-CM | POA: Diagnosis not present

## 2021-08-26 DIAGNOSIS — J00 Acute nasopharyngitis [common cold]: Secondary | ICD-10-CM | POA: Diagnosis not present

## 2021-11-03 DIAGNOSIS — B349 Viral infection, unspecified: Secondary | ICD-10-CM | POA: Diagnosis not present

## 2022-04-03 ENCOUNTER — Encounter: Payer: Self-pay | Admitting: Family Medicine

## 2022-04-03 ENCOUNTER — Ambulatory Visit (INDEPENDENT_AMBULATORY_CARE_PROVIDER_SITE_OTHER): Payer: Federal, State, Local not specified - PPO | Admitting: Family Medicine

## 2022-04-03 VITALS — BP 122/85 | HR 74 | Temp 98.4°F | Ht 70.5 in | Wt 173.0 lb

## 2022-04-03 DIAGNOSIS — R41840 Attention and concentration deficit: Secondary | ICD-10-CM

## 2022-04-03 DIAGNOSIS — E785 Hyperlipidemia, unspecified: Secondary | ICD-10-CM | POA: Diagnosis not present

## 2022-04-03 DIAGNOSIS — Z0000 Encounter for general adult medical examination without abnormal findings: Secondary | ICD-10-CM

## 2022-04-03 DIAGNOSIS — H9193 Unspecified hearing loss, bilateral: Secondary | ICD-10-CM

## 2022-04-03 LAB — CBC
HCT: 42.7 % (ref 39.0–52.0)
Hemoglobin: 14.4 g/dL (ref 13.0–17.0)
MCHC: 33.7 g/dL (ref 30.0–36.0)
MCV: 103.8 fl — ABNORMAL HIGH (ref 78.0–100.0)
Platelets: 270 10*3/uL (ref 150.0–400.0)
RBC: 4.11 Mil/uL — ABNORMAL LOW (ref 4.22–5.81)
RDW: 12.4 % (ref 11.5–15.5)
WBC: 9.6 10*3/uL (ref 4.0–10.5)

## 2022-04-03 LAB — COMPREHENSIVE METABOLIC PANEL
ALT: 37 U/L (ref 0–53)
AST: 24 U/L (ref 0–37)
Albumin: 4.2 g/dL (ref 3.5–5.2)
Alkaline Phosphatase: 87 U/L (ref 39–117)
BUN: 13 mg/dL (ref 6–23)
CO2: 30 mEq/L (ref 19–32)
Calcium: 9.8 mg/dL (ref 8.4–10.5)
Chloride: 102 mEq/L (ref 96–112)
Creatinine, Ser: 0.85 mg/dL (ref 0.40–1.50)
GFR: 120.81 mL/min (ref >60.00)
Glucose, Bld: 81 mg/dL (ref 70–99)
Potassium: 4.5 mEq/L (ref 3.5–5.1)
Sodium: 139 mEq/L (ref 135–145)
Total Bilirubin: 0.3 mg/dL (ref 0.2–1.2)
Total Protein: 7.1 g/dL (ref 6.0–8.3)

## 2022-04-03 LAB — LDL CHOLESTEROL, DIRECT: Direct LDL: 134 mg/dL

## 2022-04-03 LAB — LIPID PANEL
Cholesterol: 192 mg/dL (ref 0–200)
HDL: 55.2 mg/dL (ref >39.00)
NonHDL: 136.89
Total CHOL/HDL Ratio: 3
Triglycerides: 364 mg/dL — ABNORMAL HIGH (ref 0.0–149.0)
VLDL: 72.8 mg/dL — ABNORMAL HIGH (ref 0.0–40.0)

## 2022-04-03 NOTE — Patient Instructions (Addendum)
If you do not hear anything about your referral in the next 1-2 weeks, call our office and ask for an update.  Give [place] 2-3 business days to get the results of your labs back.   Keep the diet clean and stay active.  Please get me a copy of your advanced directive form at your convenience.   I recommend getting the flu shot in mid October. This suggestion would change if the CDC comes out with a different recommendation.   Do monthly self testicular checks in the shower. You are feeling for lumps/bumps that don't belong. If you feel anything like this, let me know!  Let us know if you need anything.

## 2022-04-03 NOTE — Progress Notes (Signed)
Chief Complaint  Patient presents with   Annual Exam    Hard to focus especially at the end of the day.     Well Male [name] is here for a complete physical.   His last physical was >1 year ago.  Current diet: in general, a "healthy" diet.   Current exercise: cycling, lifting wts Weight trend: stable Fatigue out of ordinary? No. Seat belt? Yes.   Advanced directive? 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 Has been going on for 6-7 mo. Getting worse. More noticeable at the end of the shift. Gets around 7-8 hrs of sleep nightly on average. No hx of ADD/ADHD as a kid. May have had it but was not diagnosed. No personal hx of anxiety/depression.   Past Medical History:  Diagnosis Date   GERD (gastroesophageal reflux disease)      Past Surgical History:  Procedure Laterality Date   HERNIA REPAIR      Medications  Takes no meds routinely.     Allergies No Known Allergies  Family History Family History  Problem Relation Age of Onset   Hypertension Mother    Arthritis Mother     Review of Systems: Constitutional: no fevers or chills Eye:  no recent significant change in vision Ear/Nose/Mouth/Throat:  Ears:  +trouble hearing b/l Nose/Mouth/Throat:  no complaints of nasal congestion, no sore throat Cardiovascular:  no chest pain Respiratory:  no shortness of breath Gastrointestinal:  no abdominal pain, no change in bowel habits GU:  Male: negative for dysuria Musculoskeletal/Extremities:  no pain of the joints Integumentary (Skin/Breast):  no abnormal skin lesions reported Neurologic:  no headaches Endocrine: No unexpected weight changes Hematologic/Lymphatic:  no night sweats  Exam BP 122/85   Pulse 74   Temp 98.4 F (36.9 C) (Oral)   Ht 5' 10.5" (1.791 m)   Wt 173 lb (78.5 kg)   SpO2 98%   BMI 24.47 kg/m  General:  well developed, well nourished, in no apparent distress Skin:  no significant  moles, warts, or growths Head:  no masses, lesions, or tenderness Eyes:  pupils equal and round, sclera anicteric without injection Ears:  canals without lesions, TMs shiny without retraction, no obvious effusion, no erythema Nose:  nares patent, mucosa normal Throat/Pharynx:  lips and gingiva without lesion; tongue and uvula midline; non-inflamed pharynx; no exudates or postnasal drainage Neck: neck supple without adenopathy, thyromegaly, or masses Lungs:  clear to auscultation, breath sounds equal bilaterally, no respiratory distress Cardio:  regular rate and rhythm, no bruits, no LE edema Abdomen:  abdomen soft, nontender; bowel sounds normal; no masses or organomegaly Genital (male): Deferred Rectal: Deferred Musculoskeletal:  symmetrical muscle groups noted without atrophy or deformity Extremities:  no clubbing, cyanosis, or edema, no deformities, no skin discoloration Neuro:  gait normal; deep tendon reflexes normal and symmetric Psych: well oriented with normal range of affect and appropriate judgment/insight  Assessment and Plan  Well adult exam - Plan: CBC, Comprehensive metabolic panel, Lipid panel  Inattention - Plan: Ambulatory referral to Psychology  Bilateral hearing loss, unspecified hearing loss type - Plan: Ambulatory referral to Audiology   Well 26 y.o. male. Counseled on diet and exercise. Advanced directive form provided today.  Inattention: Refer beh health for ADHD eval.  Hearing loss: Could be from loud noise exposure, refer to audiology.  Self testicular exams recommended at least monthly.  Other orders as above. Follow up in 1 year  pending the above workup. The patient voiced understanding and agreement to the plan.  Jilda Roche [place], DO 04/03/22 1:10 PM

## 2022-04-08 NOTE — Addendum Note (Signed)
Addended by: Jeronimo Greaves on: 04/08/2022 04:31 PM   Modules accepted: Orders

## 2022-04-13 ENCOUNTER — Ambulatory Visit: Payer: Federal, State, Local not specified - PPO | Attending: Audiologist | Admitting: Audiologist

## 2022-04-13 DIAGNOSIS — H9193 Unspecified hearing loss, bilateral: Secondary | ICD-10-CM | POA: Diagnosis not present
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
## 2022-05-11 ENCOUNTER — Other Ambulatory Visit (INDEPENDENT_AMBULATORY_CARE_PROVIDER_SITE_OTHER): Payer: Federal, State, Local not specified - PPO

## 2022-05-11 DIAGNOSIS — E785 Hyperlipidemia, unspecified: Secondary | ICD-10-CM | POA: Diagnosis not present

## 2022-05-12 LAB — LIPID PANEL
Cholesterol: 201 mg/dL — ABNORMAL HIGH (ref 0–200)
HDL: 54.4 mg/dL (ref >39.00)
NonHDL: 146.8
Total CHOL/HDL Ratio: 4
Triglycerides: 264 mg/dL — ABNORMAL HIGH (ref 0.0–149.0)
VLDL: 52.8 mg/dL — ABNORMAL HIGH (ref 0.0–40.0)

## 2022-05-12 LAB — LDL CHOLESTEROL, DIRECT: Direct LDL: 115 mg/dL

## 2023-03-04 ENCOUNTER — Encounter (INDEPENDENT_AMBULATORY_CARE_PROVIDER_SITE_OTHER): Payer: Self-pay

## 2023-04-06 ENCOUNTER — Encounter: Payer: Self-pay | Admitting: Family Medicine

## 2023-06-23 ENCOUNTER — Ambulatory Visit
Admission: EM | Admit: 2023-06-23 | Discharge: 2023-06-23 | Disposition: A | Discharge status: Home / Self Care | Payer: Self-pay | Attending: Internal Medicine | Admitting: Internal Medicine

## 2023-06-23 ENCOUNTER — Other Ambulatory Visit: Payer: Self-pay

## 2023-06-23 ENCOUNTER — Encounter: Payer: Self-pay | Admitting: *Deleted

## 2023-06-23 DIAGNOSIS — R051 Acute cough: Secondary | ICD-10-CM

## 2023-06-23 DIAGNOSIS — B349 Viral infection, unspecified: Secondary | ICD-10-CM

## 2023-06-23 LAB — POC COVID19/FLU A&B COMBO
Covid Antigen, POC: NEGATIVE
Influenza A Antigen, POC: NEGATIVE
Influenza B Antigen, POC: NEGATIVE

## 2023-06-23 MED ORDER — PROMETHAZINE-DM 6.25-15 MG/5ML PO SYRP: 5.0000 mL | ORAL_SOLUTION | Freq: Four times a day (QID) | ORAL | 0 refills | Status: AC | PRN

## 2023-06-23 NOTE — ED Triage Notes (Signed)
C/O starting 3 days ago with some fatigue and general malaise with chest soreness, then started with "scratchy" cough yesterday with worsening. C/O tactile fever this AM.

## 2023-06-23 NOTE — ED Provider Notes (Signed)
[facility]    CSN: [id number] Arrival date & time: 06/23/23  1236      History   Chief Complaint Chief Complaint  Patient presents with   Cough    HPI [name] is a 27 y.o. male  presents for evaluation of URI symptoms for 3 days. Patient reports associated symptoms of cough, congestion, fatigue, malaise, fevers of 99 degrees. Denies N/V/D, sore throat, body aches, shortness of breath. Patient does not have a hx of asthma. Patient has previously vaped.  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  Pt has taken ibuprofen OTC for symptoms. Pt has no other concerns at this time.    Cough   Past Medical History:  Diagnosis Date   GERD (gastroesophageal reflux disease)     There are no problems to display for this patient.   Past Surgical History:  Procedure Laterality Date   HERNIA REPAIR         Home Medications    Prior to Admission medications   Medication Sig Start Date End Date Taking? Authorizing Provider  promethazine-dextromethorphan (PROMETHAZINE-DM) 6.25-15 MG/5ML syrup Take 5 mLs by mouth 4 (four) times daily as needed for cough. 06/23/23  Yes Radford Pax, NP    Family History Family History  Problem Relation Age of Onset   Hypertension Mother    Arthritis Mother     Social History Social History   Tobacco Use   Smoking status: Former    Types: Cigarettes   Tobacco comments:    only on occ  Vaping Use   Vaping status: Every Day  Substance Use Topics   Alcohol use: Yes    Comment: once/weekend   Drug use: Yes    Types: Marijuana     Allergies   Patient has no known allergies.   Review of Systems Review of Systems  Constitutional:  Positive for fatigue.  HENT:  Positive for congestion.   Respiratory:  Positive for cough.      Physical Exam Triage Vital Signs ED Triage Vitals  Encounter Vitals Group     BP 06/23/23 1325 (!) 143/85     Systolic BP Percentile --      Diastolic BP Percentile --      Pulse  Rate 06/23/23 1325 90     Resp 06/23/23 1325 20     Temp 06/23/23 1325 98.1 F (36.7 C)     Temp Source 06/23/23 1325 Oral     SpO2 06/23/23 1325 97 %     Weight --      Height --      Head Circumference --      Peak Flow --      Pain Score 06/23/23 1327 6     Pain Loc --      Pain Education --      Exclude from Growth Chart --    No data found.  Updated Vital Signs BP (!) 143/85   Pulse 90   Temp 98.1 F (36.7 C) (Oral)   Resp 20   SpO2 97%   Visual Acuity Right Eye Distance:   Left Eye Distance:   Bilateral Distance:    Right Eye Near:   Left Eye Near:    Bilateral Near:     Physical Exam Vitals and nursing note reviewed.  Constitutional:      General: He is not in acute distress.    Appearance: Normal appearance. He is not ill-appearing or toxic-appearing.  HENT:  Head: Normocephalic and atraumatic.     Right Ear: Tympanic membrane and ear canal normal.     Left Ear: Tympanic membrane and ear canal normal.     Nose: Congestion present.     Mouth/Throat:     Mouth: Mucous membranes are moist.     Pharynx: No oropharyngeal exudate or posterior oropharyngeal erythema.  Eyes:     Pupils: Pupils are equal, round, and reactive to light.  Cardiovascular:     Rate and Rhythm: Normal rate and regular rhythm.     Heart sounds: Normal heart sounds.  Pulmonary:     Effort: Pulmonary effort is normal.     Breath sounds: Normal breath sounds.  Musculoskeletal:     Cervical back: Normal range of motion and neck supple.  Lymphadenopathy:     Cervical: No cervical adenopathy.  Skin:    General: Skin is warm and dry.  Neurological:     General: No focal deficit present.     Mental Status: He is alert and oriented to person, place, and time.  Psychiatric:        Mood and Affect: Mood normal.        Behavior: Behavior normal.      UC Treatments / Results  Labs (all labs ordered are listed, but only abnormal results are displayed) Labs Reviewed  POC  COVID19/FLU A&B COMBO    EKG   Radiology No results found.  Procedures Procedures (including critical care time)  Medications Ordered in UC Medications - No data to display  Initial Impression / Assessment and Plan / UC Course  I have reviewed the triage vital signs and the nursing notes.  Pertinent labs & imaging results that were available during my care of the patient were reviewed by me and considered in my medical decision making (see chart for details).     Reviewed exam and symptoms with patient.  No red flags.  Negative rapid flu and COVID testing.  Patient declined chest x-ray.  Discussed viral illness and symptomatic treatment.  PCP follow-up 2 days for recheck.  ER precautions reviewed. Final Clinical Impressions(s) / UC Diagnoses   Final diagnoses:  Acute cough  Viral illness     Discharge Instructions      Start Promethazine DM as needed for cough.  Please of this medication can make you drowsy.  Do not drink alcohol or drive on this medication.  Lots of rest and fluids.  Please follow-up with your PCP in 2 days for recheck.  Please go to the ER for any worsening symptoms.  I hope you feel better soon!     ED Prescriptions     Medication Sig Dispense Auth. Provider   promethazine-dextromethorphan (PROMETHAZINE-DM) 6.25-15 MG/5ML syrup Take 5 mLs by mouth 4 (four) times daily as needed for cough. 118 mL Radford Pax, NP      PDMP not reviewed this encounter.   Radford Pax, NP 06/23/23 1414

## 2023-06-23 NOTE — Discharge Instructions (Addendum)
Start Promethazine DM as needed for cough.  Please of this medication can make you drowsy.  Do not drink alcohol or drive on this medication.  Lots of rest and fluids.  Please follow-up with your PCP in 2 days for recheck.  Please go to the ER for any worsening symptoms.  I hope you feel better soon!

## 2023-06-24 ENCOUNTER — Ambulatory Visit: Payer: Self-pay

## 2023-11-01 ENCOUNTER — Other Ambulatory Visit: Payer: Self-pay | Admitting: Family Medicine
